# Patient Record
Sex: Male | Born: 1995 | Race: Black or African American | Hispanic: No | Marital: Single | Smoking: Never smoker
Health system: Southern US, Community
[De-identification: ages and names within clinical notes are randomized; demographics above are authoritative.]

## PROBLEM LIST (undated history)

## (undated) HISTORY — PX: WISDOM TOOTH EXTRACTION: SHX21

---
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
## 2015-03-08 ENCOUNTER — Ambulatory Visit
Admission: RE | Admit: 2015-03-08 | Discharge: 2015-03-08 | Disposition: A | Discharge status: Home / Self Care | Payer: Medicaid Other | Source: Ambulatory Visit | Attending: Family Medicine | Admitting: Family Medicine

## 2015-03-08 ENCOUNTER — Other Ambulatory Visit: Payer: Self-pay | Admitting: Family Medicine

## 2015-03-08 DIAGNOSIS — S76311A Strain of muscle, fascia and tendon of the posterior muscle group at thigh level, right thigh, initial encounter: Secondary | ICD-10-CM | POA: Diagnosis not present

## 2015-03-08 DIAGNOSIS — M25561 Pain in right knee: Secondary | ICD-10-CM | POA: Insufficient documentation

## 2015-03-08 DIAGNOSIS — R52 Pain, unspecified: Secondary | ICD-10-CM

## 2015-03-09 ENCOUNTER — Other Ambulatory Visit: Payer: Self-pay | Admitting: Family Medicine

## 2015-03-09 DIAGNOSIS — M25561 Pain in right knee: Secondary | ICD-10-CM

## 2015-03-09 DIAGNOSIS — S8991XA Unspecified injury of right lower leg, initial encounter: Secondary | ICD-10-CM

## 2015-03-09 DIAGNOSIS — R609 Edema, unspecified: Secondary | ICD-10-CM

## 2015-03-10 ENCOUNTER — Ambulatory Visit
Admission: RE | Admit: 2015-03-10 | Discharge: 2015-03-10 | Disposition: A | Discharge status: Home / Self Care | Payer: Medicaid Other | Source: Ambulatory Visit | Attending: Family Medicine | Admitting: Family Medicine

## 2015-03-10 DIAGNOSIS — R609 Edema, unspecified: Secondary | ICD-10-CM

## 2015-03-10 DIAGNOSIS — S83231A Complex tear of medial meniscus, current injury, right knee, initial encounter: Secondary | ICD-10-CM | POA: Insufficient documentation

## 2015-03-10 DIAGNOSIS — M25461 Effusion, right knee: Secondary | ICD-10-CM | POA: Insufficient documentation

## 2015-03-10 DIAGNOSIS — M25561 Pain in right knee: Secondary | ICD-10-CM

## 2015-03-10 DIAGNOSIS — S86811A Strain of other muscle(s) and tendon(s) at lower leg level, right leg, initial encounter: Secondary | ICD-10-CM | POA: Insufficient documentation

## 2015-03-10 DIAGNOSIS — S8991XA Unspecified injury of right lower leg, initial encounter: Secondary | ICD-10-CM | POA: Diagnosis not present

## 2015-12-22 ENCOUNTER — Encounter: Payer: Self-pay | Admitting: Emergency Medicine

## 2015-12-22 ENCOUNTER — Emergency Department
Admission: EM | Admit: 2015-12-22 | Discharge: 2015-12-22 | Disposition: A | Discharge status: Home / Self Care | Payer: Medicaid Other | Attending: Emergency Medicine | Admitting: Emergency Medicine

## 2015-12-22 DIAGNOSIS — Z202 Contact with and (suspected) exposure to infections with a predominantly sexual mode of transmission: Secondary | ICD-10-CM | POA: Diagnosis present

## 2015-12-22 LAB — CHLAMYDIA/NGC RT PCR (ARMC ONLY)
Chlamydia Tr: DETECTED — AB
N GONORRHOEAE: NOT DETECTED

## 2015-12-22 MED ORDER — CEFTRIAXONE SODIUM 250 MG IJ SOLR
250.0000 mg | Freq: Once | INTRAMUSCULAR | Status: AC
  Administered 2015-12-22: 250 mg via INTRAMUSCULAR
  Filled 2015-12-22: qty 250

## 2015-12-22 MED ORDER — AZITHROMYCIN 500 MG PO TABS
1000.0000 mg | ORAL_TABLET | Freq: Once | ORAL | Status: AC
Start: 2015-12-22 — End: 2015-12-22
  Administered 2015-12-22: 1000 mg via ORAL
  Filled 2015-12-22: qty 2

## 2015-12-22 NOTE — Discharge Instructions (Signed)
Sexually Transmitted Disease  A sexually transmitted disease (STD) is a disease or infection that may be passed (transmitted) from person to person, usually during sexual activity. This may happen by way of saliva, semen, blood, vaginal mucus, or urine. Common STDs include:  · Gonorrhea.  · Chlamydia.  · Syphilis.  · HIV and AIDS.  · Genital herpes.  · Hepatitis B and C.  · Trichomonas.  · Human papillomavirus (HPV).  · Pubic lice.  · Scabies.  · Mites.  · Bacterial vaginosis.  WHAT ARE CAUSES OF STDs?  An STD may be caused by bacteria, a virus, or parasites. STDs are often transmitted during sexual activity if one person is infected. However, they may also be transmitted through nonsexual means. STDs may be transmitted after:   · Sexual intercourse with an infected person.  · Sharing sex toys with an infected person.  · Sharing needles with an infected person or using unclean piercing or tattoo needles.  · Having intimate contact with the genitals, mouth, or rectal areas of an infected person.  · Exposure to infected fluids during birth.  WHAT ARE THE SIGNS AND SYMPTOMS OF STDs?  Different STDs have different symptoms. Some people may not have any symptoms. If symptoms are present, they may include:  · Painful or bloody urination.  · Pain in the pelvis, abdomen, vagina, anus, throat, or eyes.  · A skin rash, itching, or irritation.  · Growths, ulcerations, blisters, or sores in the genital and anal areas.  · Abnormal vaginal discharge with or without bad odor.  · Penile discharge in men.  · Fever.  · Pain or bleeding during sexual intercourse.  · Swollen glands in the groin area.  · Yellow skin and eyes (jaundice). This is seen with hepatitis.  · Swollen testicles.  · Infertility.  · Sores and blisters in the mouth.  HOW ARE STDs DIAGNOSED?  To make a diagnosis, your health care provider may:  · Take a medical history.  · Perform a physical exam.  · Take a sample of any discharge to examine.  · Swab the throat,  cervix, opening to the penis, rectum, or vagina for testing.  · Test a sample of your first morning urine.  · Perform blood tests.  · Perform a Pap test, if this applies.  · Perform a colposcopy.  · Perform a laparoscopy.  HOW ARE STDs TREATED?  Treatment depends on the STD. Some STDs may be treated but not cured.  · Chlamydia, gonorrhea, trichomonas, and syphilis can be cured with antibiotic medicine.  · Genital herpes, hepatitis, and HIV can be treated, but not cured, with prescribed medicines. The medicines lessen symptoms.  · Genital warts from HPV can be treated with medicine or by freezing, burning (electrocautery), or surgery. Warts may come back.  · HPV cannot be cured with medicine or surgery. However, abnormal areas may be removed from the cervix, vagina, or vulva.  · If your diagnosis is confirmed, your recent sexual partners need treatment. This is true even if they are symptom-free or have a negative culture or evaluation. They should not have sex until their health care providers say it is okay.  · Your health care provider may test you for infection again 3 months after treatment.  HOW CAN I REDUCE MY RISK OF GETTING AN STD?  Take these steps to reduce your risk of getting an STD:  · Use latex condoms, dental dams, and water-soluble lubricants during sexual activity. Do not use   petroleum jelly or oils.  · Avoid having multiple sex partners.  · Do not have sex with someone who has other sex partners  · Do not have sex with anyone you do not know or who is at high risk for an STD.  · Avoid risky sex practices that can break your skin.  · Do not have sex if you have open sores on your mouth or skin.  · Avoid drinking too much alcohol or taking illegal drugs. Alcohol and drugs can affect your judgment and put you in a vulnerable position.  · Avoid engaging in oral and anal sex acts.  · Get vaccinated for HPV and hepatitis. If you have not received these vaccines in the past, talk to your health care  provider about whether one or both might be right for you.  · If you are at risk of being infected with HIV, it is recommended that you take a prescription medicine daily to prevent HIV infection. This is called pre-exposure prophylaxis (PrEP). You are considered at risk if:    You are a man who has sex with other men (MSM).    You are a heterosexual man or woman and are sexually active with more than one partner.    You take drugs by injection.    You are sexually active with a partner who has HIV.  · Talk with your health care provider about whether you are at high risk of being infected with HIV. If you choose to begin PrEP, you should first be tested for HIV. You should then be tested every 3 months for as long as you are taking PrEP.  WHAT SHOULD I DO IF I THINK I HAVE AN STD?  · See your health care provider.  · Tell your sexual partner(s). They should be tested and treated for any STDs.  · Do not have sex until your health care provider says it is okay.  WHEN SHOULD I GET IMMEDIATE MEDICAL CARE?  Contact your health care provider right away if:   · You have severe abdominal pain.  · You are a man and notice swelling or pain in your testicles.  · You are a woman and notice swelling or pain in your vagina.     This information is not intended to replace advice given to you by your health care provider. Make sure you discuss any questions you have with your health care provider.     Document Released: 10/04/2002 Document Revised: 08/04/2014 Document Reviewed: 02/01/2013  Elsevier Interactive Patient Education ©2016 [employer]    Safe Sex  Safe sex is about reducing the risk of giving or getting a sexually transmitted disease (STD). STDs are spread through sexual contact involving the genitals, mouth, or rectum. Some STDs can be cured and others cannot. Safe sex can also prevent unintended pregnancies.   WHAT ARE SOME SAFE SEX PRACTICES?  · Limit your sexual activity to only one partner who is having sex with  only you.  · Talk to your partner about his or her past partners, past STDs, and drug use.  · Use a condom every time you have sexual intercourse. This includes vaginal, oral, and anal sexual activity. Both females and males should wear condoms during oral sex. Only use latex or polyurethane condoms and water-based lubricants. Using petroleum-based lubricants or oils to lubricate a condom will weaken the condom and increase the chance that it will break. The condom should be in place from the beginning to   the end of sexual activity. Wearing a condom reduces, but does not completely eliminate, your risk of getting or giving an STD. STDs can be spread by contact with infected body fluids and skin.  · Get vaccinated for hepatitis B and HPV.  · Avoid alcohol and recreational drugs, which can affect your judgment. You may forget to use a condom or participate in high-risk sex.  · For females, avoid douching after sexual intercourse. Douching can spread an infection farther into the reproductive tract.  · Check your body for signs of sores, blisters, rashes, or unusual discharge. See your health care provider if you notice any of these signs.  · Avoid sexual contact if you have symptoms of an infection or are being treated for an STD. If you or your partner has herpes, avoid sexual contact when blisters are present. Use condoms at all other times.  · If you are at risk of being infected with HIV, it is recommended that you take a prescription medicine daily to prevent HIV infection. This is called pre-exposure prophylaxis (PrEP). You are considered at risk if:    You are a man who has sex with other men (MSM).    You are a heterosexual man or woman who is sexually active with more than one partner.    You take drugs by injection.    You are sexually active with a partner who has HIV.  · Talk with your health care provider about whether you are at high risk of being infected with HIV. If you choose to begin PrEP, you  should first be tested for HIV. You should then be tested every 3 months for as long as you are taking PrEP.  · See your health care provider for regular screenings, exams, and tests for other STDs. Before having sex with a new partner, each of you should be screened for STDs and should talk about the results with each other.  WHAT ARE THE BENEFITS OF SAFE SEX?   · There is less chance of getting or giving an STD.  · You can prevent unwanted or unintended pregnancies.  · By discussing safe sex concerns with your partner, you may increase feelings of intimacy, comfort, trust, and honesty between the two of you.     This information is not intended to replace advice given to you by your health care provider. Make sure you discuss any questions you have with your health care provider.     Document Released: 08/21/2004 Document Revised: 08/04/2014 Document Reviewed: 01/05/2012  Elsevier Interactive Patient Education ©2016 Elsevier Inc.

## 2015-12-22 NOTE — ED Notes (Signed)
States girlfriend dx with chlamydia - he wants checked

## 2015-12-22 NOTE — ED Provider Notes (Signed)
[facility] Emergency Department Provider Note  ____________________________________________  Time seen: Approximately 9:10 PM  I have reviewed the triage vital signs and the nursing notes.   HISTORY  Chief Complaint Exposure to STD    HPI Scott Blair is a 20 y.o. male who presents emergency Department with a complaint of wanting to be checked for chlamydia. He states that his girlfriend was diagnosed with same and he wants testing for same. Patient denies any dysuria, polyuria, hematuria, penile discharge, skin lesions on genitalia. Patient denies fever or chills, abdominal pain, nausea or vomiting.   History reviewed. No pertinent past medical history.  There are no active problems to display for this patient.   History reviewed. No pertinent past surgical history.  No current outpatient prescriptions on file.  Allergies Review of patient's allergies indicates no known allergies.  History reviewed. No pertinent family history.  Social History Social History  Substance Use Topics  . Smoking status: Never Smoker   . Smokeless tobacco: None  . Alcohol Use: No     Review of Systems  Constitutional: No fever/chills Cardiovascular: no chest pain. Respiratory: no cough. No SOB. Gastrointestinal: No abdominal pain.  No nausea, no vomiting.   Genitourinary: Negative for dysuria. No hematuria. Denies penile discharge or penile lesions. Musculoskeletal: Negative for musculoskeletal pain. Skin: Negative for rash, abrasions, lacerations, ecchymosis. Neurological: Negative for headaches, focal weakness or numbness. 10-point ROS otherwise negative.  ____________________________________________   PHYSICAL EXAM:  VITAL SIGNS: ED Triage Vitals  Enc Vitals Group     BP 12/22/15 1939 165/103 mmHg     Pulse Rate 12/22/15 1939 65     Resp 12/22/15 1939 18     Temp 12/22/15 1939 98 F (36.7 C)     Temp src --      SpO2 12/22/15 1939 100 %   Weight 12/22/15 1939 195 lb (88.451 kg)     Height 12/22/15 1939  (1.753 m)     Head Cir --      Peak Flow --      Pain Score --      Pain Loc --      Pain Edu? --      Excl. in GC? --      Constitutional: Alert and oriented. Well appearing and in no acute distress. Eyes: Conjunctivae are normal. PERRL. EOMI. Head: Atraumatic. Cardiovascular: Normal rate, regular rhythm. Normal S1 and S2.  Good peripheral circulation. Respiratory: Normal respiratory effort without tachypnea or retractions. Lungs CTAB. Good air entry to the bases with no decreased or absent breath sounds. Gastrointestinal: Bowel sounds 4 quadrants. Soft and nontender to palpation. No guarding or rigidity. No palpable masses. No distention. No CVA tenderness. Musculoskeletal: Full range of motion to all extremities. No gross deformities appreciated. Neurologic:  Normal speech and language. No gross focal neurologic deficits are appreciated.  Skin:  Skin is warm, dry and intact. No rash noted. Psychiatric: Mood and affect are normal. Speech and behavior are normal. Patient exhibits appropriate insight and judgement.   ____________________________________________   LABS (all labs ordered are listed, but only abnormal results are displayed)  Labs Reviewed  CHLAMYDIA/NGC RT PCR [facility] ONLY)   ____________________________________________  EKG   ____________________________________________  RADIOLOGY   No results found.  ____________________________________________    PROCEDURES  Procedure(s) performed:       Medications  cefTRIAXone (ROCEPHIN) injection 250 mg (not administered)  azithromycin (ZITHROMAX) tablet 1,000 mg (1,000 mg Oral Given 12/22/15 2136)  ____________________________________________   INITIAL IMPRESSION / ASSESSMENT AND PLAN / ED COURSE  Pertinent labs & imaging results that were available during my care of the patient were reviewed by me and considered in  my medical decision making (see chart for details).  Patient's diagnosis is consistent with exposure to STD. Urine is sent for testing with gonorrhea and chlamydia. We will call patient with these results. Patient is treated prophylactically for gonorrhea and chlamydia due to known exposure. Patient will follow-up with health Department in 3 weeks for recheck should his lab work returned positive for either gonorrhea or chlamydia..  Patient is given ED precautions to return to the ED for any worsening or new symptoms.     ____________________________________________  FINAL CLINICAL IMPRESSION(S) / ED DIAGNOSES  Final diagnoses:  Exposure to STD      NEW MEDICATIONS STARTED DURING THIS VISIT:  New Prescriptions   No medications on file        This chart was dictated using voice recognition software/Dragon. Despite best efforts to proofread, errors can occur which can change the meaning. Any change was purely unintentional.    Racheal PatchesJonathan D Cuthriell, PA-C 12/22/15 2137  Jeanmarie PlantJames A McShane, MD 12/23/15 [phone]

## 2015-12-25 ENCOUNTER — Telehealth: Payer: Self-pay | Admitting: Emergency Medicine

## 2015-12-25 NOTE — ED Notes (Signed)
Called and informed of positive chlamydia test.  He was treated in the ED.

## 2016-03-18 ENCOUNTER — Ambulatory Visit
Admission: RE | Admit: 2016-03-18 | Discharge: 2016-03-18 | Disposition: A | Discharge status: Home / Self Care | Payer: Medicaid Other | Source: Ambulatory Visit | Attending: Family Medicine | Admitting: Family Medicine

## 2016-03-18 ENCOUNTER — Other Ambulatory Visit: Payer: Self-pay | Admitting: Family Medicine

## 2016-03-18 DIAGNOSIS — M25552 Pain in left hip: Secondary | ICD-10-CM

## 2016-03-18 NOTE — Progress Notes (Signed)
Patient not seen sent for left hip x-ray. Dunn view asked to be included.

## 2016-03-19 ENCOUNTER — Other Ambulatory Visit: Payer: Self-pay | Admitting: Family Medicine

## 2016-03-19 DIAGNOSIS — M25852 Other specified joint disorders, left hip: Secondary | ICD-10-CM

## 2016-03-20 ENCOUNTER — Ambulatory Visit: Payer: Medicaid Other

## 2016-03-20 ENCOUNTER — Other Ambulatory Visit: Payer: Self-pay | Admitting: Family Medicine

## 2016-03-20 ENCOUNTER — Ambulatory Visit
Admission: RE | Admit: 2016-03-20 | Discharge: 2016-03-20 | Disposition: A | Discharge status: Home / Self Care | Payer: Medicaid Other | Source: Ambulatory Visit | Attending: Family Medicine | Admitting: Family Medicine

## 2016-03-20 DIAGNOSIS — M6289 Other specified disorders of muscle: Secondary | ICD-10-CM | POA: Insufficient documentation

## 2016-03-20 DIAGNOSIS — S76212A Strain of adductor muscle, fascia and tendon of left thigh, initial encounter: Secondary | ICD-10-CM | POA: Insufficient documentation

## 2016-03-20 DIAGNOSIS — M25852 Other specified joint disorders, left hip: Secondary | ICD-10-CM

## 2016-03-20 DIAGNOSIS — M76892 Other specified enthesopathies of left lower limb, excluding foot: Secondary | ICD-10-CM | POA: Diagnosis present

## 2016-03-20 DIAGNOSIS — M79673 Pain in unspecified foot: Secondary | ICD-10-CM

## 2017-04-14 ENCOUNTER — Other Ambulatory Visit: Payer: Self-pay | Admitting: Family Medicine

## 2017-04-14 DIAGNOSIS — M79673 Pain in unspecified foot: Secondary | ICD-10-CM

## 2017-04-15 LAB — VITAMIN D 25 HYDROXY (VIT D DEFICIENCY, FRACTURES): Vit D, 25-Hydroxy: 32.6 ng/mL (ref 30.0–100.0)

## 2017-04-23 ENCOUNTER — Other Ambulatory Visit: Payer: Self-pay | Admitting: Orthopaedic Surgery

## 2017-04-23 DIAGNOSIS — M722 Plantar fascial fibromatosis: Secondary | ICD-10-CM

## 2017-04-23 DIAGNOSIS — M84375A Stress fracture, left foot, initial encounter for fracture: Secondary | ICD-10-CM

## 2017-04-23 DIAGNOSIS — M79672 Pain in left foot: Secondary | ICD-10-CM

## 2017-04-27 ENCOUNTER — Other Ambulatory Visit: Payer: Self-pay | Admitting: Orthopaedic Surgery

## 2017-04-27 DIAGNOSIS — M79672 Pain in left foot: Secondary | ICD-10-CM

## 2017-04-27 DIAGNOSIS — M722 Plantar fascial fibromatosis: Secondary | ICD-10-CM

## 2017-04-27 DIAGNOSIS — M84375A Stress fracture, left foot, initial encounter for fracture: Secondary | ICD-10-CM

## 2017-04-28 ENCOUNTER — Other Ambulatory Visit: Payer: Self-pay | Admitting: Orthopaedic Surgery

## 2017-04-28 DIAGNOSIS — M722 Plantar fascial fibromatosis: Secondary | ICD-10-CM

## 2017-04-28 DIAGNOSIS — M84374A Stress fracture, right foot, initial encounter for fracture: Secondary | ICD-10-CM

## 2017-04-29 ENCOUNTER — Ambulatory Visit
Admission: RE | Admit: 2017-04-29 | Discharge: 2017-04-29 | Disposition: A | Discharge status: Home / Self Care | Payer: PRIVATE HEALTH INSURANCE | Source: Ambulatory Visit | Attending: Orthopaedic Surgery | Admitting: Orthopaedic Surgery

## 2017-04-29 DIAGNOSIS — M79672 Pain in left foot: Secondary | ICD-10-CM

## 2017-04-29 DIAGNOSIS — M722 Plantar fascial fibromatosis: Secondary | ICD-10-CM | POA: Diagnosis not present

## 2017-04-29 DIAGNOSIS — M84375A Stress fracture, left foot, initial encounter for fracture: Secondary | ICD-10-CM | POA: Diagnosis not present

## 2017-04-29 DIAGNOSIS — R609 Edema, unspecified: Secondary | ICD-10-CM | POA: Insufficient documentation

## 2017-04-29 DIAGNOSIS — M84374A Stress fracture, right foot, initial encounter for fracture: Secondary | ICD-10-CM

## 2017-06-30 ENCOUNTER — Other Ambulatory Visit: Payer: Self-pay | Admitting: Family Medicine

## 2017-06-30 ENCOUNTER — Ambulatory Visit
Admission: RE | Admit: 2017-06-30 | Discharge: 2017-06-30 | Disposition: A | Discharge status: Home / Self Care | Payer: PRIVATE HEALTH INSURANCE | Source: Ambulatory Visit | Attending: Family Medicine | Admitting: Family Medicine

## 2017-06-30 ENCOUNTER — Ambulatory Visit: Payer: PRIVATE HEALTH INSURANCE

## 2017-06-30 ENCOUNTER — Other Ambulatory Visit: Payer: PRIVATE HEALTH INSURANCE

## 2017-06-30 DIAGNOSIS — M25562 Pain in left knee: Secondary | ICD-10-CM | POA: Insufficient documentation

## 2017-06-30 DIAGNOSIS — S76301S Unspecified injury of muscle, fascia and tendon of the posterior muscle group at thigh level, right thigh, sequela: Secondary | ICD-10-CM

## 2017-06-30 DIAGNOSIS — M25561 Pain in right knee: Secondary | ICD-10-CM

## 2017-07-01 ENCOUNTER — Ambulatory Visit
Admission: RE | Admit: 2017-07-01 | Discharge: 2017-07-01 | Disposition: A | Discharge status: Home / Self Care | Payer: PRIVATE HEALTH INSURANCE | Source: Ambulatory Visit | Attending: Family Medicine | Admitting: Family Medicine

## 2017-07-01 DIAGNOSIS — M25562 Pain in left knee: Secondary | ICD-10-CM | POA: Diagnosis not present

## 2017-07-01 DIAGNOSIS — R937 Abnormal findings on diagnostic imaging of other parts of musculoskeletal system: Secondary | ICD-10-CM | POA: Diagnosis not present

## 2017-07-01 DIAGNOSIS — S76301S Unspecified injury of muscle, fascia and tendon of the posterior muscle group at thigh level, right thigh, sequela: Secondary | ICD-10-CM

## 2017-07-01 DIAGNOSIS — M7122 Synovial cyst of popliteal space [Baker], left knee: Secondary | ICD-10-CM | POA: Diagnosis not present

## 2017-07-01 DIAGNOSIS — X58XXXA Exposure to other specified factors, initial encounter: Secondary | ICD-10-CM | POA: Diagnosis not present

## 2017-07-01 DIAGNOSIS — S83241A Other tear of medial meniscus, current injury, right knee, initial encounter: Secondary | ICD-10-CM | POA: Diagnosis not present

## 2017-07-01 DIAGNOSIS — M7121 Synovial cyst of popliteal space [Baker], right knee: Secondary | ICD-10-CM | POA: Diagnosis not present

## 2017-09-25 ENCOUNTER — Emergency Department
Admission: EM | Admit: 2017-09-25 | Discharge: 2017-09-25 | Disposition: A | Discharge status: Home / Self Care | Payer: PRIVATE HEALTH INSURANCE | Attending: Emergency Medicine | Admitting: Emergency Medicine

## 2017-09-25 ENCOUNTER — Encounter: Payer: Self-pay | Admitting: Emergency Medicine

## 2017-09-25 ENCOUNTER — Other Ambulatory Visit: Payer: Self-pay

## 2017-09-25 DIAGNOSIS — Z202 Contact with and (suspected) exposure to infections with a predominantly sexual mode of transmission: Secondary | ICD-10-CM | POA: Diagnosis present

## 2017-09-25 LAB — URINALYSIS, COMPLETE (UACMP) WITH MICROSCOPIC
BACTERIA UA: NONE SEEN
BILIRUBIN URINE: NEGATIVE
Glucose, UA: NEGATIVE mg/dL
Hgb urine dipstick: NEGATIVE
KETONES UR: NEGATIVE mg/dL
LEUKOCYTES UA: NEGATIVE
Nitrite: NEGATIVE
PH: 5 (ref 5.0–8.0)
Protein, ur: NEGATIVE mg/dL
Specific Gravity, Urine: 1.024 (ref 1.005–1.030)

## 2017-09-25 LAB — CHLAMYDIA/NGC RT PCR (ARMC ONLY)
CHLAMYDIA TR: NOT DETECTED
N GONORRHOEAE: NOT DETECTED

## 2017-09-25 NOTE — Discharge Instructions (Signed)
Follow-up with community health department as needed.

## 2017-09-25 NOTE — ED Triage Notes (Signed)
Pt to ED wanted to get checked for STD.  Denies any change in urinary habits, pain, rashes, or discharge.  States girlfriend was checked for an STD recently and results were negative but patient was advised to be seen.

## 2017-09-25 NOTE — ED Notes (Signed)
See triage note   Presents requesting to be checked for STD  Denies any sxs'

## 2017-09-25 NOTE — ED Provider Notes (Signed)
[facility] Emergency Department Provider Note   ____________________________________________   First MD Initiated Contact with Patient 09/25/17 1422     (approximate)  I have reviewed the triage vital signs and the nursing notes.   HISTORY  Chief Complaint STD check    HPI Scott Blair is a 22 y.o. male patient requests STD check.  Patient denies urethral discharge or dysuria.  Patient girlfriend was checked for STD and he was told she was negative but wants to be checked because he does not trust her.  History reviewed. No pertinent past medical history.  There are no active problems to display for this patient.   Past Surgical History:  Procedure Laterality Date  . WISDOM TOOTH EXTRACTION      Prior to Admission medications   Not on File    Allergies Patient has no known allergies.  History reviewed. No pertinent family history.  Social History Social History   Tobacco Use  . Smoking status: Never Smoker  . Smokeless tobacco: Never Used  Substance Use Topics  . Alcohol use: No  . Drug use: No    Review of Systems Constitutional: No fever/chills Eyes: No visual changes. ENT: No sore throat. Cardiovascular: Denies chest pain. Respiratory: Denies shortness of breath. Gastrointestinal: No abdominal pain.  No nausea, no vomiting.  No diarrhea.  No constipation. Genitourinary: Negative for dysuria. Musculoskeletal: Negative for back pain. Skin: Negative for rash. Neurological: Negative for headaches, focal weakness or numbness.   ____________________________________________   PHYSICAL EXAM:  VITAL SIGNS: ED Triage Vitals  Enc Vitals Group     BP 09/25/17 1412 (!) 114/55     Pulse Rate 09/25/17 1412 75     Resp 09/25/17 1412 14     Temp 09/25/17 1412 97.7 F (36.5 C)     Temp Source 09/25/17 1412 Oral     SpO2 09/25/17 1412 100 %     Weight 09/25/17 1413 183 lb (83 kg)     Height 09/25/17 1413 5\' 10"  (1.778 m)   Head Circumference --      Peak Flow --      Pain Score --      Pain Loc --      Pain Edu? --      Excl. in GC? --    Constitutional: Alert and oriented. Well appearing and in no acute distress. Cardiovascular: Normal rate, regular rhythm. Grossly normal heart sounds.  Good peripheral circulation. Respiratory: Normal respiratory effort.  No retractions. Lungs CTAB. Genitourinary: No lesion or urethral discharge. Skin:  Skin is warm, dry and intact. No rash noted. Psychiatric: Mood and affect are normal. Speech and behavior are normal.  ____________________________________________   LABS (all labs ordered are listed, but only abnormal results are displayed)  Labs Reviewed  URINALYSIS, COMPLETE (UACMP) WITH MICROSCOPIC - Abnormal; Notable for the following components:      Result Value   Color, Urine YELLOW (*)    APPearance CLEAR (*)    Squamous Epithelial / LPF 0-5 (*)    All other components within normal limits  CHLAMYDIA/NGC RT PCR ([facility] ONLY)   ____________________________________________  EKG   ____________________________________________  RADIOLOGY  ED MD interpretation:    Official radiology report(s): No results found.  ____________________________________________   PROCEDURES  Procedure(s) performed: None  Procedures  Critical Care performed: No  ____________________________________________   INITIAL IMPRESSION / ASSESSMENT AND PLAN / ED COURSE  As part of my medical decision making, I reviewed the following data within the  electronic MEDICAL RECORD NUMBER    Patient reports to the ED for STD evaluation secondary to possible exposure.  Patient labs were negative for urinary tract infection, chlamydia, or gonorrhea.  Advised patient if further concern to follow-up with the [facility] health department.      ____________________________________________   FINAL CLINICAL IMPRESSION(S) / ED DIAGNOSES  Final diagnoses:  Possible  exposure to STD     ED Discharge Orders    None       Note:  This document was prepared using Dragon voice recognition software and may include unintentional dictation errors.    Joni ReiningSmith, Myleah Cavendish K, PA-C 09/25/17 1630    Schaevitz, Myra Rudeavid Matthew, MD 09/26/17 1524

## 2019-11-16 IMAGING — MR MR KNEE*L* W/O CM
6 series · 37 of 40 positions shown · non-contrast
Comparison: None.

CLINICAL DATA: Left lateral knee pain

EXAM:
MRI OF THE LEFT KNEE WITHOUT CONTRAST
TECHNIQUE: Multiplanar, multisequence MR imaging of the knee was performed. No
intravenous contrast was administered.

[Series 3: PD fat-sat · axial · 3.0mm · 0.50mm/px · z∈[-63,+50]mm · 8 of 35 slices shown (1 of 4)]
[im 1/35]
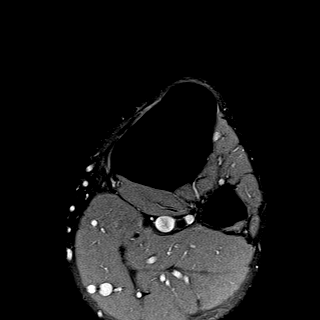
[im 5/35]
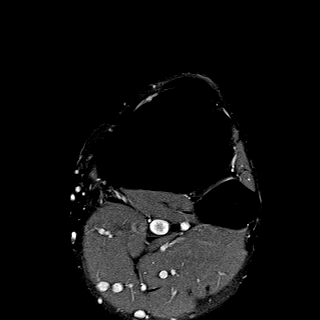
[im 10/35]
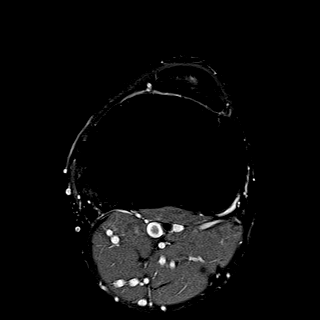
[im 15/35]
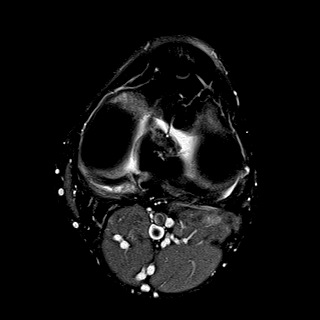
[im 20/35]
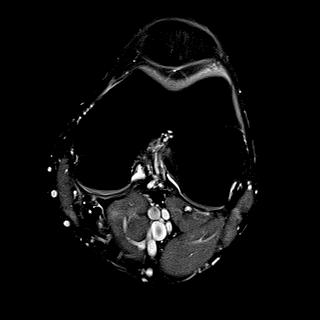
[im 25/35]
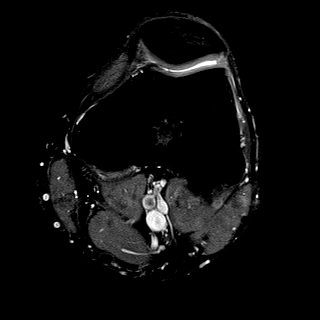
[im 30/35]
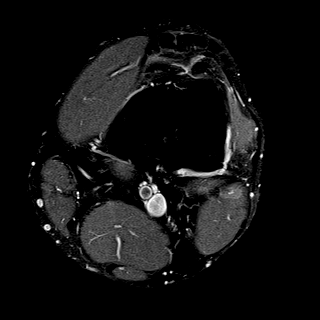
[im 35/35]
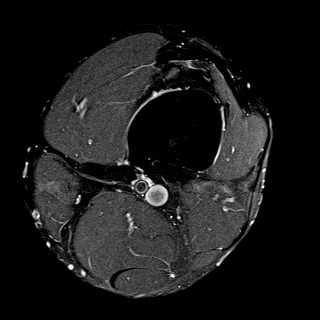

[Series 4: T1 · coronal · 3.0mm · 0.50mm/px · 4 of 33 slices shown]
[im 1/33]
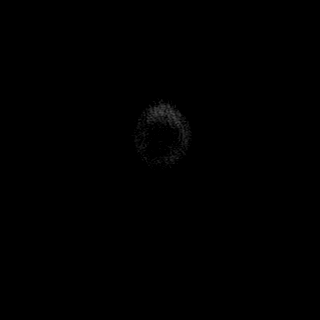
[im 6/33]
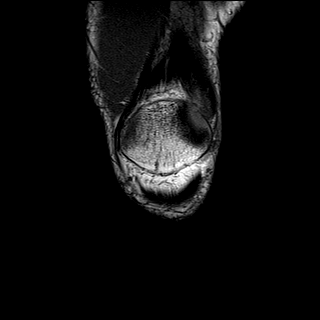
[im 11/33]
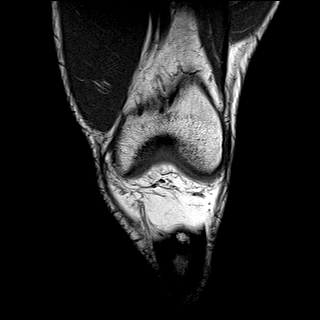
[im 17/33]
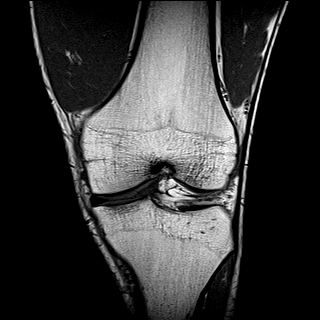

[Series 5: T2 fat-sat · coronal · 3.0mm · 0.31mm/px · 7 of 33 slices shown]
[im 1/33]
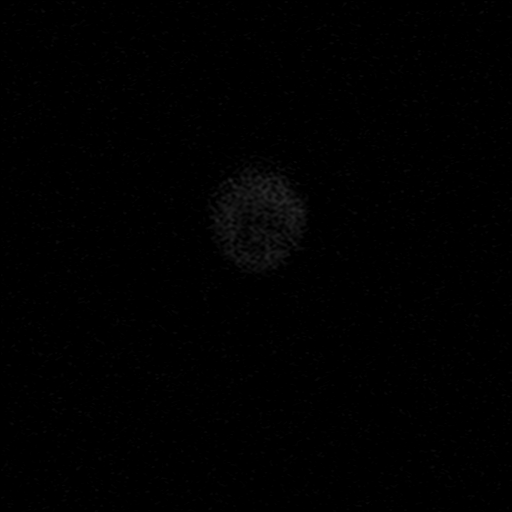
[im 6/33]
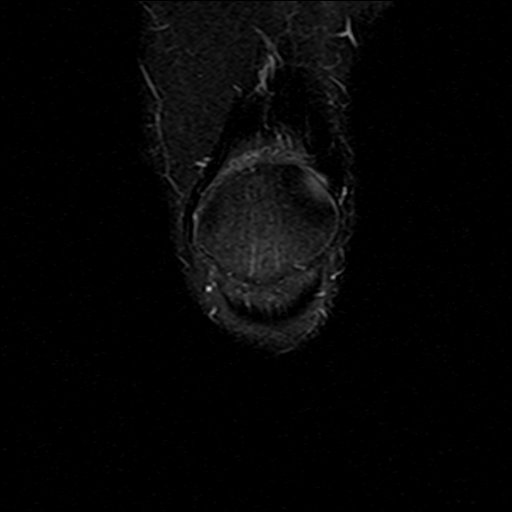
[im 11/33]
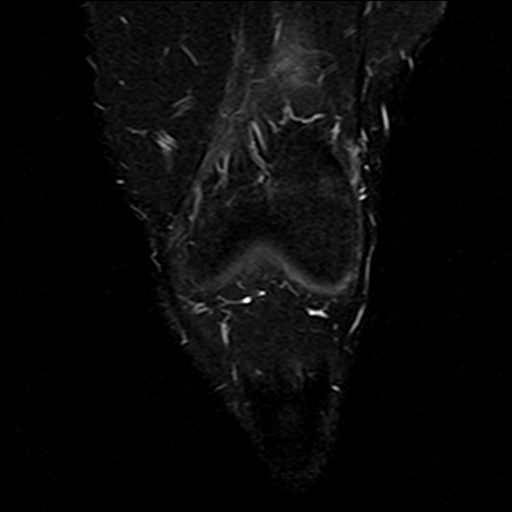
[im 17/33]
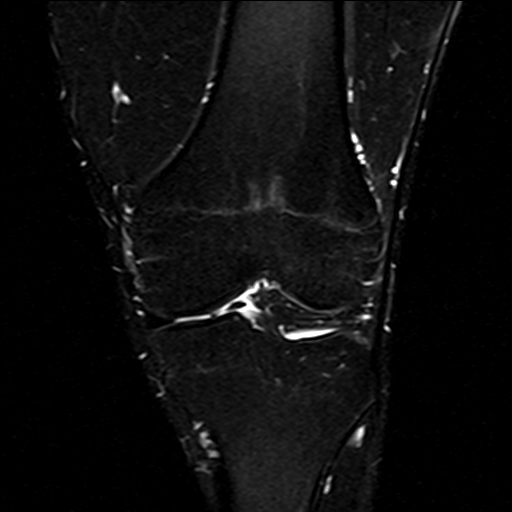
[im 22/33]
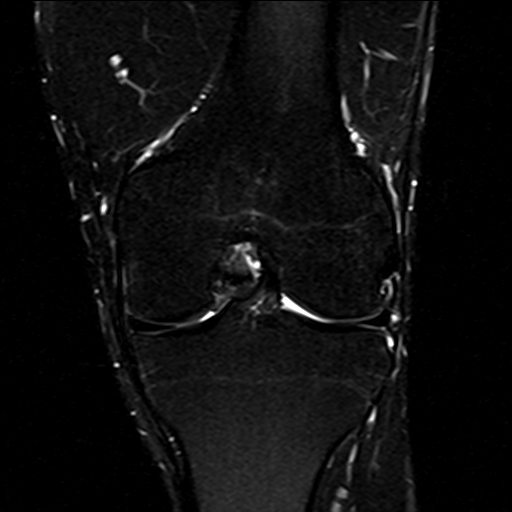
[im 27/33]
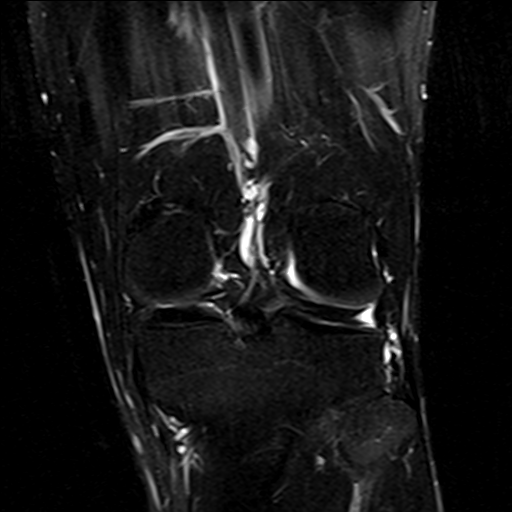
[im 33/33]
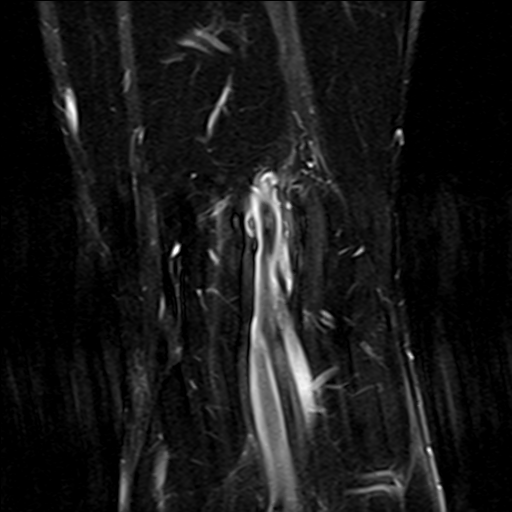

[Series 6: PD fat-sat · coronal · 3.0mm · 0.50mm/px · 7 of 33 slices shown (2 of 4)]
[im 1/33]
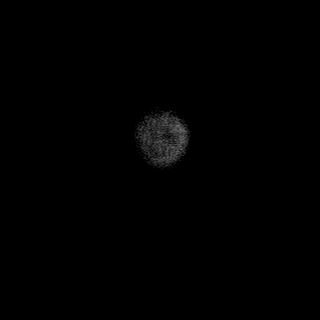
[im 6/33]
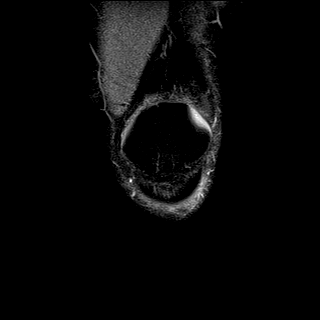
[im 11/33]
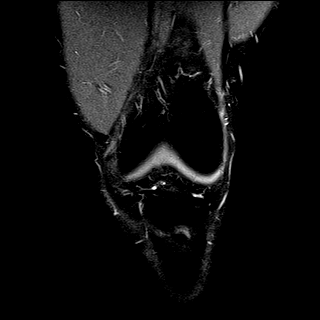
[im 17/33]
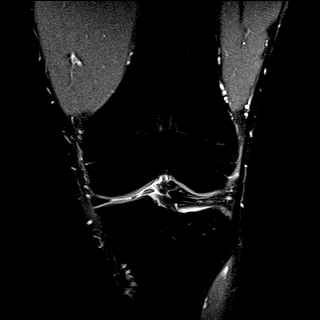
[im 22/33]
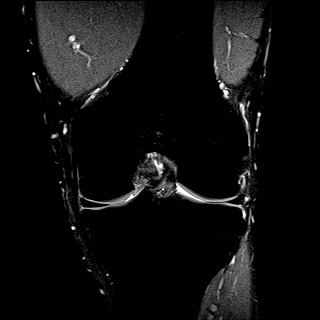
[im 27/33]
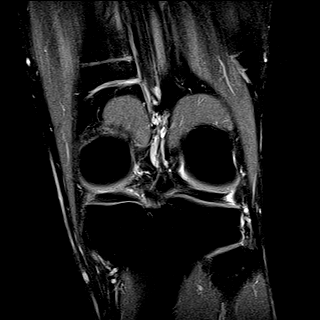
[im 33/33]
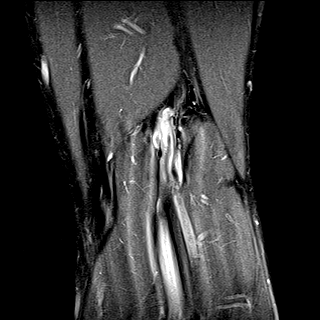

[Series 7: PD fat-sat · sagittal · 3.0mm · 0.50mm/px · 7 of 35 slices shown (3 of 4)]
[im 1/35]
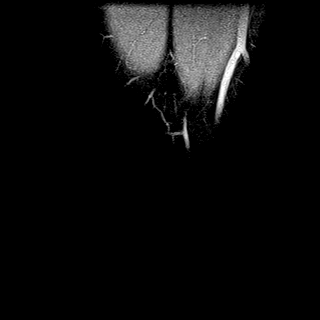
[im 6/35]
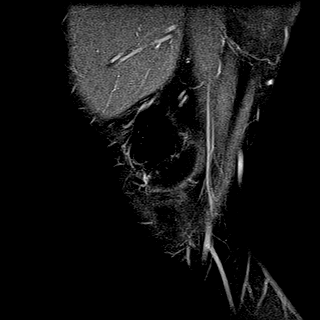
[im 12/35]
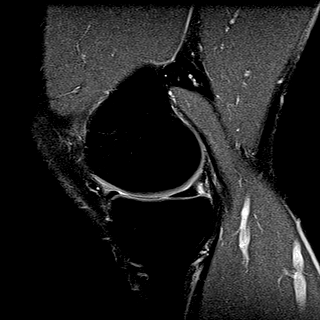
[im 18/35]
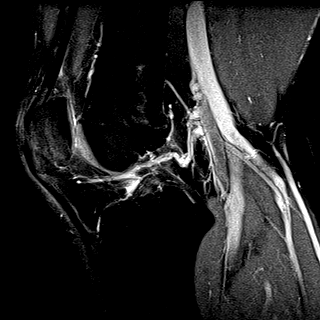
[im 23/35]
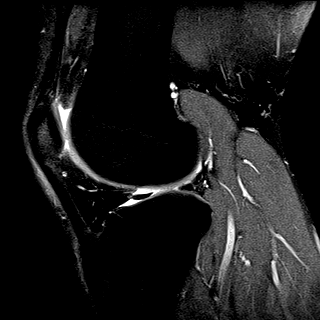
[im 29/35]
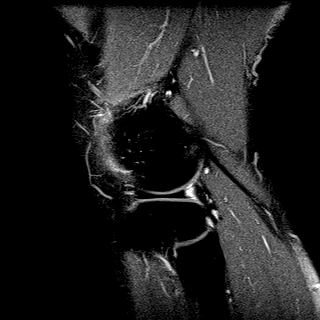
[im 35/35]
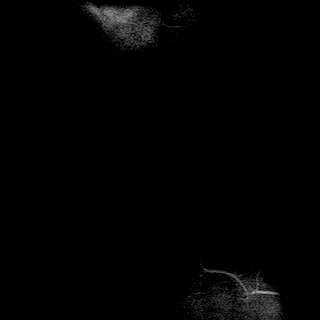

[Series 8: PD fat-sat · coronal · 2.0mm · 0.62mm/px · 4 of 21 slices shown (4 of 4)]
[im 1/21]
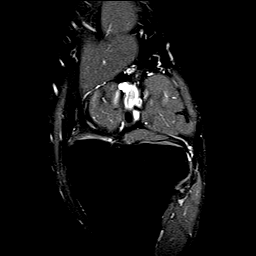
[im 7/21]
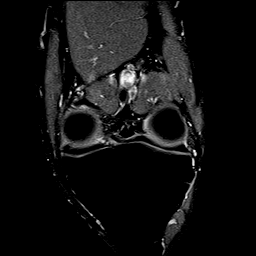
[im 14/21]
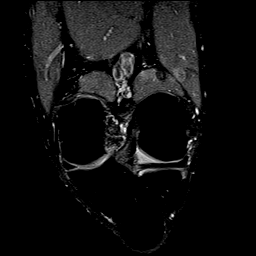
[im 21/21]
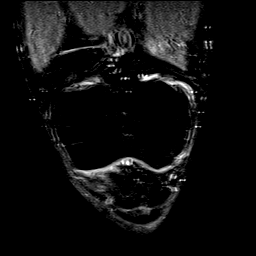

[37 of 40 positions shown; findings below may reference images not displayed]

FINDINGS: MENISCI

Medial meniscus: Small superior surface tear along the posterior
horn on image [DATE] also shown on images 7-8 series 8. This
represents minimal superior surface irregularity.

Lateral meniscus:  Unremarkable

LIGAMENTS

Cruciates:  Unremarkable

Collaterals:  Unremarkable

CARTILAGE

Patellofemoral:  Unremarkable

Medial:  Unremarkable

Lateral:  Unremarkable

Joint:  Upper normal amount of fluid in the knee joint.

Popliteal Fossa:  Very small Baker's cyst.

Extensor Mechanism:  Unremarkable

Bones: No significant extra-articular osseous abnormalities
identified.

Other: No supplemental non-categorized findings.
IMPRESSION: 1. Very subtle superior surface irregularity in the posterior horn
medial meniscus. Fraying versus very small grade 3 tear.
2. Upper normal amount of fluid in the knee joint.
3. Very small Baker's cyst.
